# Patient Record
Sex: Female | Born: 2000 | Race: Black or African American | Hispanic: No | Marital: Single | Smoking: Never smoker
Health system: Southern US, Community
[De-identification: ages and names within clinical notes are randomized; demographics above are authoritative.]

---
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
## 2022-04-29 ENCOUNTER — Ambulatory Visit (HOSPITAL_COMMUNITY)
Admission: EM | Admit: 2022-04-29 | Discharge: 2022-04-29 | Disposition: A | Discharge status: Home / Self Care | Payer: Medicaid Other | Attending: Family Medicine | Admitting: Family Medicine

## 2022-04-29 ENCOUNTER — Encounter (HOSPITAL_COMMUNITY): Payer: Self-pay

## 2022-04-29 DIAGNOSIS — J020 Streptococcal pharyngitis: Secondary | ICD-10-CM | POA: Diagnosis not present

## 2022-04-29 LAB — POCT RAPID STREP A, ED / UC: Streptococcus, Group A Screen (Direct): POSITIVE — AB

## 2022-04-29 MED ORDER — AMOXICILLIN 875 MG PO TABS: 875.0000 mg | ORAL_TABLET | Freq: Two times a day (BID) | ORAL | 0 refills | Status: AC

## 2022-04-29 NOTE — ED Provider Notes (Signed)
[place]   [id number] 04/29/22 Arrival Time: [id number]  ASSESSMENT & PLAN:  1. Streptococcal sore throat    No signs of peritonsillar abscess. Discussed.  Meds ordered this encounter  Medications   amoxicillin (AMOXIL) 875 MG tablet    Sig: Take 1 tablet (875 mg total) by mouth 2 (two) times daily for 10 days.    Dispense:  20 tablet    Refill:  0    Results for orders placed or performed during the hospital encounter of 04/29/22  POCT Rapid Strep A  Result Value Ref Range   Streptococcus, Group A Screen (Direct) POSITIVE (A) NEGATIVE   Labs Reviewed  POCT RAPID STREP A, ED / UC - Abnormal; Notable for the following components:      Result Value   Streptococcus, Group A Screen (Direct) POSITIVE (*)    All other components within normal limits    OTC analgesics and throat care as needed  Instructed to finish full 10 day course of antibiotics. Will follow up if not showing significant improvement over the next 24-48 hours.    Discharge Instructions      You may use over the counter ibuprofen or acetaminophen as needed.  For a sore throat, over the counter products such as [employer] Peroxyl Mouth Sore Rinse or Chloraseptic Sore Throat Spray may provide some temporary relief.      Reviewed expectations re: course of current medical issues. Questions answered. Outlined signs and symptoms indicating need for more acute intervention. Patient verbalized understanding. After Visit Summary given.   SUBJECTIVE:  Christina Dawson is a 22 y.o. female who reports a sore throat. Describes as ***. Onset {abrupt, gradual:20671} beginning {$Time; disease onset (duration):[phone]}. Symptoms have {$Desc; symptom progression (context):[phone]} since beginning; {w-w/o:315700::"without"} voice changes. No respiratory symptoms. Normal PO intake but reports discomfort with swallowing. No specific alleviating factors. Fever: {Symptoms; fever:10080}. No neck pain or swelling. No  associated nausea, vomiting, or abdominal pain. Known sick contacts: {NONE:21772}. Recent travel: {NONE:21772}. OTC treatment: ***.  ROS: As per HPI.   OBJECTIVE:  Vitals:   04/29/22 1724  BP: 105/74  Pulse: 62  Resp: 18  Temp: 98.3 F (36.8 C)  SpO2: 99%     General appearance: alert; no distress HEENT: throat with {EXAM; THROAT APPEARANCE:23354}; uvula {DEVIATION:23064} Neck: supple with FROM; {Exam; lymph nodes:15950} Lungs: speaks full sentences without difficulty; unlabored Abd: soft; non-tender Skin: {Exam; rash strep:10090}; warm and dry Psychological: alert and cooperative; normal mood and affect  No Known Allergies  No past medical history on file. Social History   Socioeconomic History   Marital status: Single    Spouse name: Not on file   Number of children: Not on file   Years of education: Not on file   Highest education level: Not on file  Occupational History   Not on file  Tobacco Use   Smoking status: Never   Smokeless tobacco: Never  Substance and Sexual Activity   Alcohol use: Not on file   Drug use: Not on file   Sexual activity: Not on file  Other Topics Concern   Not on file  Social History Narrative   Not on file   Social Determinants of Health   Financial Resource Strain: Not on file  Food Insecurity: Not on file  Transportation Needs: Not on file  Physical Activity: Not on file  Stress: Not on file  Social Connections: Not on file  Intimate Partner Violence: Not on file   Family History  Problem  Relation Age of Onset   Healthy Mother    Healthy Father

## 2022-04-29 NOTE — ED Triage Notes (Addendum)
Pt presents with complaints of a head cold x 2 weeks total. Reports she started taking some otc medication a few days in that helped but now it is back. Reports productive cough and nasal congestion.   Pt developed sore throat over weekend and boyfriend just tested positive for strep throat.

## 2022-04-29 NOTE — Discharge Instructions (Addendum)
You may use over the counter ibuprofen or acetaminophen as needed.  For a sore throat, over the counter products such as [employer]gate Peroxyl Mouth Sore Rinse or Chloraseptic Sore Throat Spray may provide some temporary relief.     

## 2022-09-02 IMAGING — MR MR KNEE*R* W/O CM
7 series · 39 of 40 positions shown · non-contrast
Comparison: Plain films right knee 10/02/2019.

CLINICAL DATA: Lateral right knee pain for 1 month. The patient
reports a right knee injury while participating in marching band.

EXAM:
MRI OF THE RIGHT KNEE WITHOUT CONTRAST
TECHNIQUE: Multiplanar, multisequence MR imaging of the knee was performed. No
intravenous contrast was administered.

[Series 6: T2 fat-sat · axial · right · 4.0mm · 0.50mm/px · z∈[-45,+109]mm · 6 of 36 slices shown (1 of 3)]
[im 1/36]
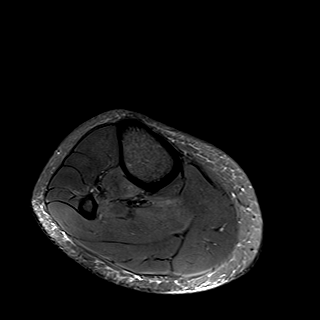
[im 8/36]
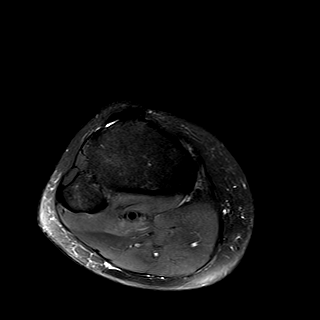
[im 15/36]
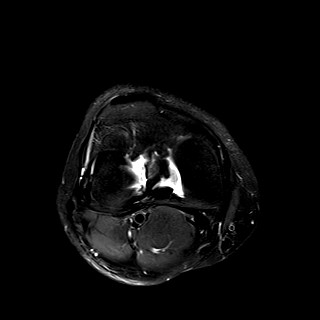
[im 22/36]
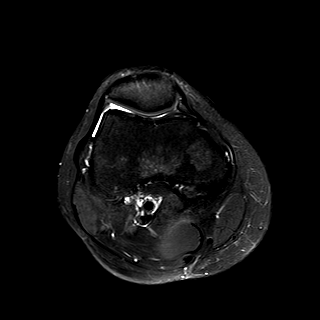
[im 29/36]
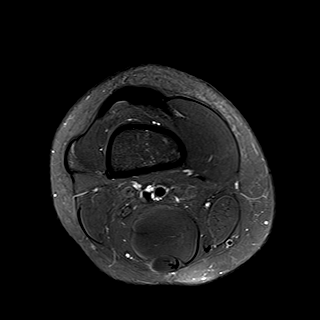
[im 36/36]
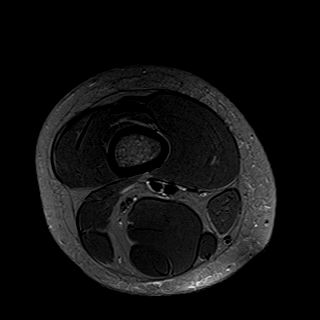

[Series 7: T2 fat-sat · coronal · right · 4.0mm · 0.39mm/px · 6 of 28 slices shown (2 of 3)]
[im 1/28]
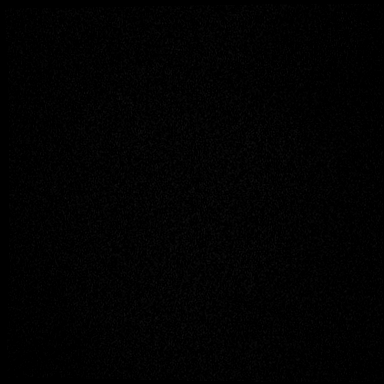
[im 6/28]
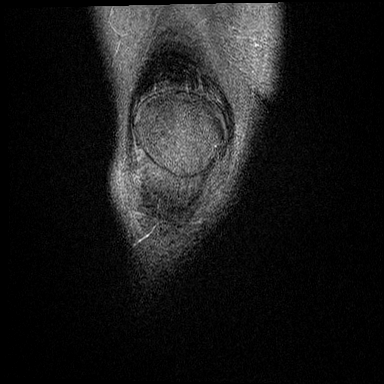
[im 11/28]
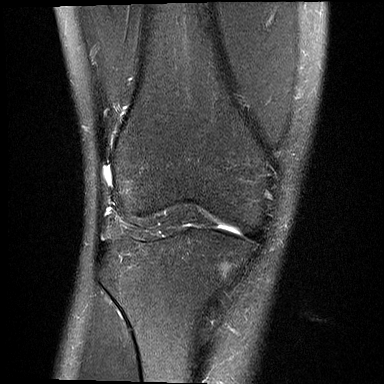
[im 17/28]
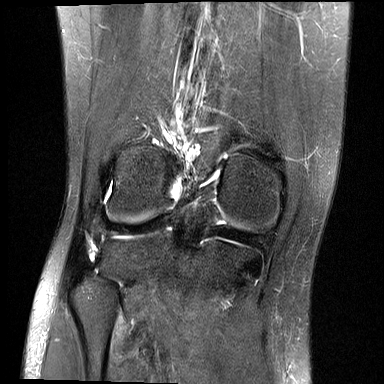
[im 22/28]
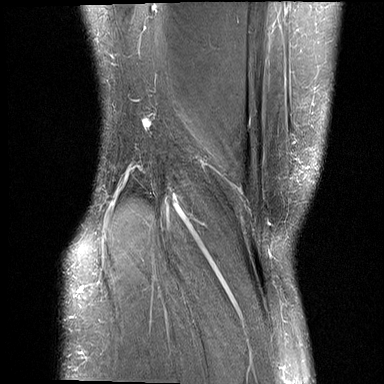
[im 28/28]
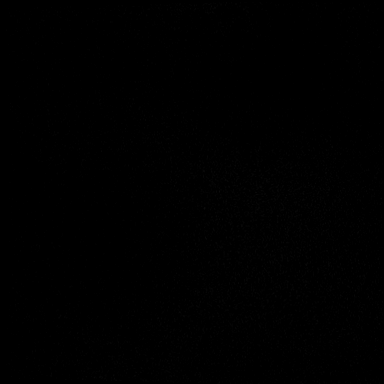

[Series 8: T1 · coronal · right · 4.0mm · 0.39mm/px · 5 of 28 slices shown]
[im 1/28]
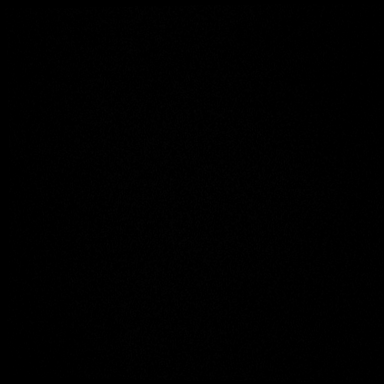
[im 6/28]
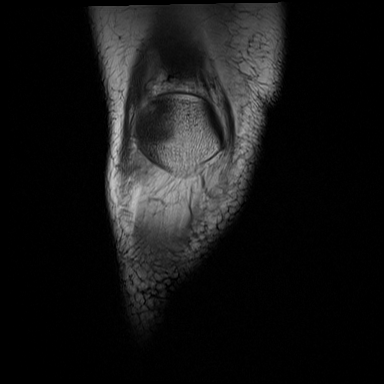
[im 11/28]
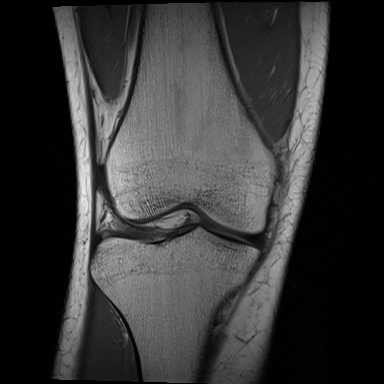
[im 17/28]
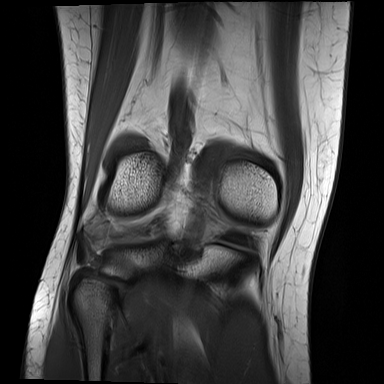
[im 22/28]
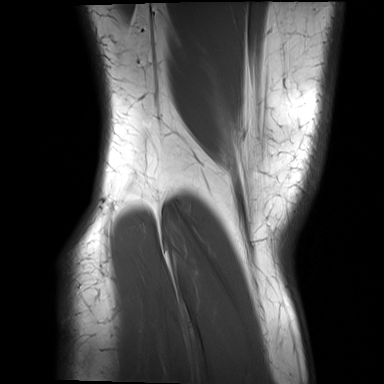

[Series 9: PD fat-sat · coronal · right · 3.0mm · 0.47mm/px · 6 of 28 slices shown (1 of 2)]
[im 1/28]
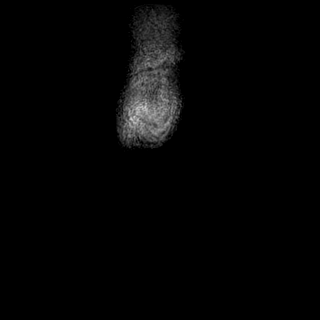
[im 6/28]
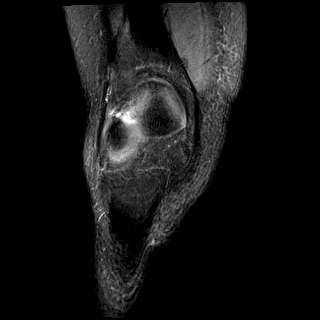
[im 11/28]
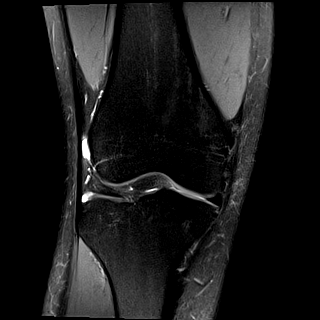
[im 17/28]
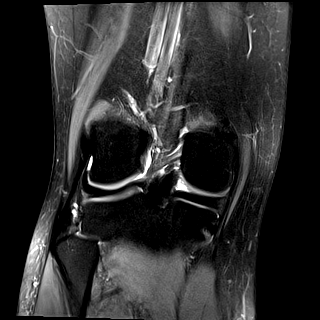
[im 22/28]
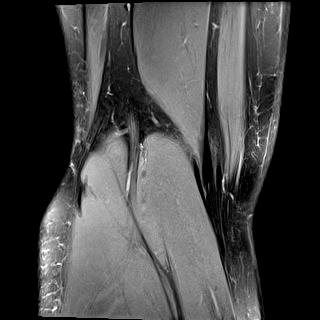
[im 28/28]
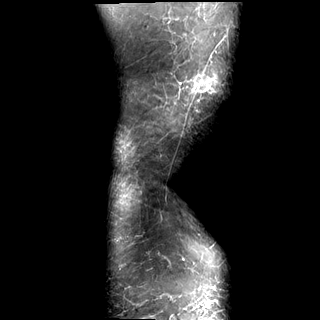

[Series 10: PD fat-sat · sagittal · right · 3.0mm · 0.39mm/px · 6 of 27 slices shown (2 of 2)]
[im 1/27]
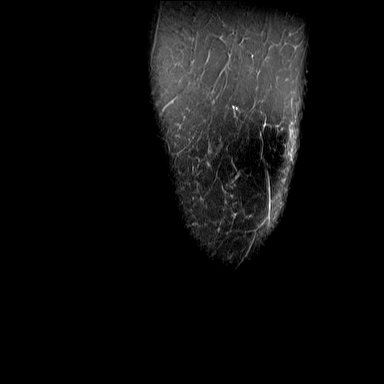
[im 6/27]
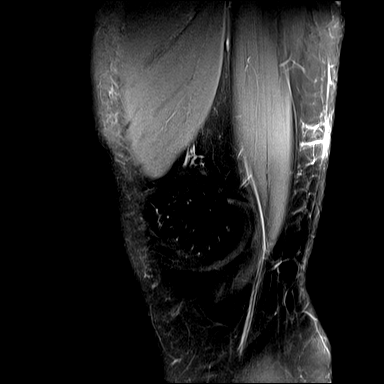
[im 11/27]
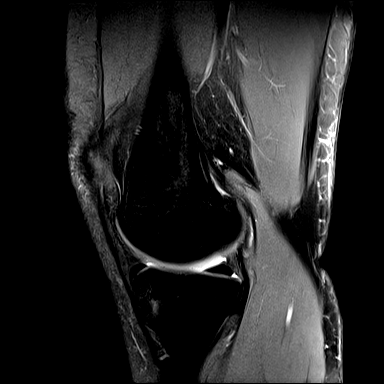
[im 16/27]
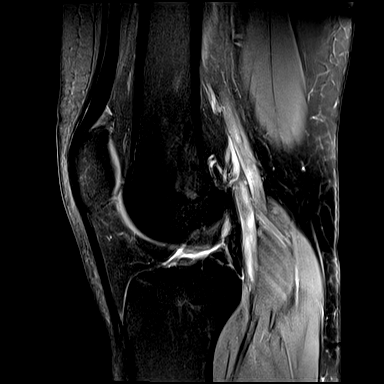
[im 21/27]
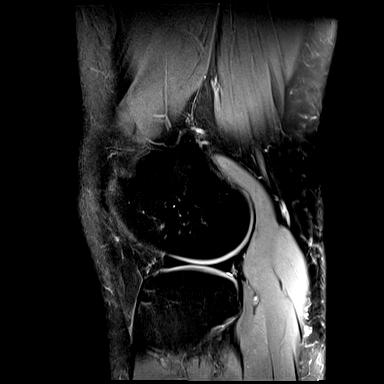
[im 27/27]
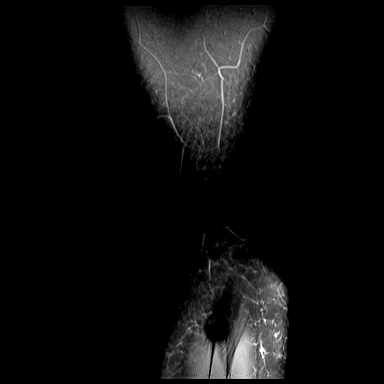

[Series 11: T2 fat-sat · sagittal · right · 3.0mm · 0.39mm/px · 6 of 27 slices shown (3 of 3)]
[im 1/27]
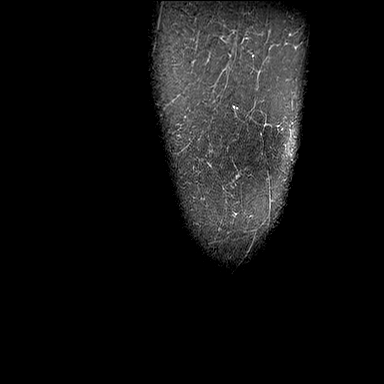
[im 6/27]
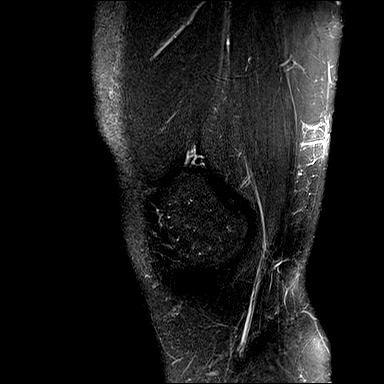
[im 11/27]
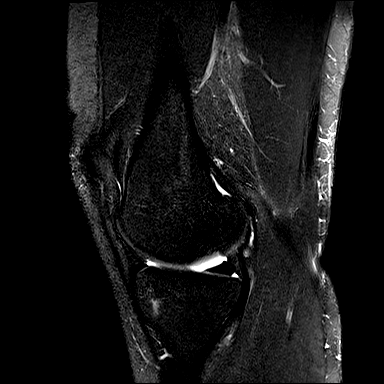
[im 16/27]
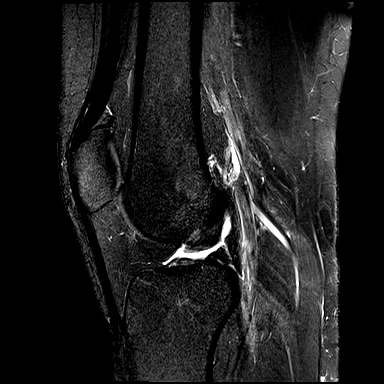
[im 21/27]
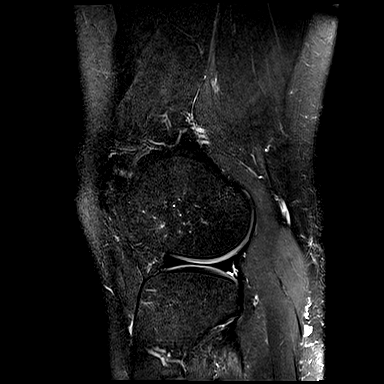
[im 27/27]
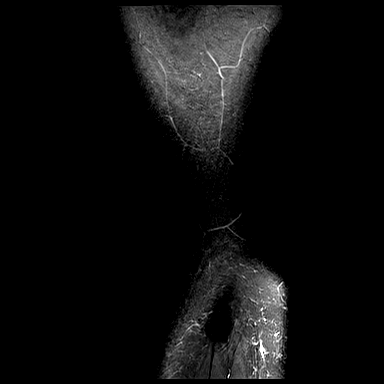

[Series 12: PD · oblique · right · 1.5mm · 0.44mm/px · 4 of 21 slices shown]
[im 1/21]
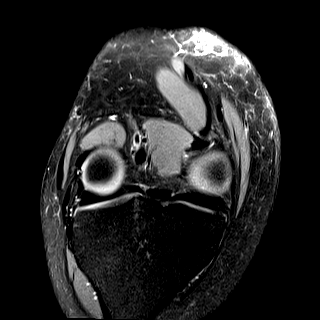
[im 7/21]
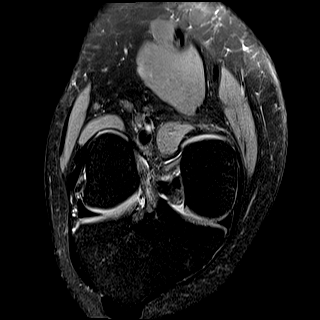
[im 14/21]
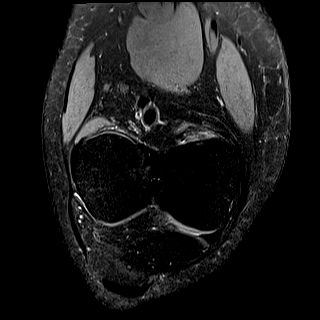
[im 21/21]
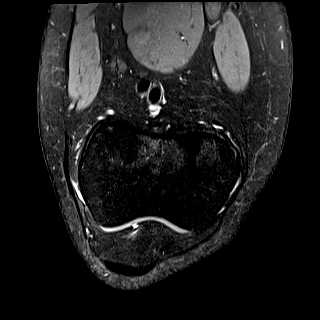

[39 of 40 positions shown; findings below may reference images not displayed]

FINDINGS: MENISCI

Medial meniscus:  Intact.

Lateral meniscus:  Intact.

LIGAMENTS

Cruciates:  Intact.

Collaterals:  Intact.

CARTILAGE

Patellofemoral:  Normal.

Medial:  Normal.

Lateral:  Normal.

Joint:  No effusion.

Popliteal Fossa:  No Baker's cyst.

Extensor Mechanism:  Intact.

Bones:  Normal marrow signal throughout.

Other: None.
IMPRESSION: Normal MRI right knee.
